# Patient Record
Sex: Female | Born: 1990 | Race: Black or African American | Hispanic: No | Marital: Married | Smoking: Never smoker
Health system: Southern US, Community
[De-identification: ages and names within clinical notes are randomized; demographics above are authoritative.]

## PROBLEM LIST (undated history)

## (undated) DIAGNOSIS — Z789 Other specified health status: Secondary | ICD-10-CM

## (undated) HISTORY — PX: NO PAST SURGERIES: SHX2092

---

## 2016-12-03 NOTE — L&D Delivery Note (Signed)
Pt was admitted for an induction. She was started on pitocin. She progressed to 3cm then rapidly completed the first stage. She pushed x 1 and had a SVD on one live viable infant over an intact perineum in the ROA position. Placenta M/I. EBL-400cc. Baby to NBN.

## 2017-05-15 ENCOUNTER — Encounter: Payer: Self-pay | Admitting: Certified Nurse Midwife

## 2017-09-07 ENCOUNTER — Inpatient Hospital Stay (EMERGENCY_DEPARTMENT_HOSPITAL)
Admission: AD | Admit: 2017-09-07 | Discharge: 2017-09-07 | Disposition: A | Discharge status: Home / Self Care | Payer: Medicaid Other | Source: Ambulatory Visit | Attending: Obstetrics and Gynecology | Admitting: Obstetrics and Gynecology

## 2017-09-07 ENCOUNTER — Encounter (HOSPITAL_COMMUNITY): Payer: Self-pay

## 2017-09-07 DIAGNOSIS — N898 Other specified noninflammatory disorders of vagina: Secondary | ICD-10-CM

## 2017-09-07 DIAGNOSIS — Z3A40 40 weeks gestation of pregnancy: Secondary | ICD-10-CM

## 2017-09-07 DIAGNOSIS — O9989 Other specified diseases and conditions complicating pregnancy, childbirth and the puerperium: Secondary | ICD-10-CM | POA: Diagnosis not present

## 2017-09-07 DIAGNOSIS — O26893 Other specified pregnancy related conditions, third trimester: Secondary | ICD-10-CM | POA: Diagnosis not present

## 2017-09-07 LAB — WET PREP, GENITAL
CLUE CELLS WET PREP: NONE SEEN
Sperm: NONE SEEN
Trich, Wet Prep: NONE SEEN
Yeast Wet Prep HPF POC: NONE SEEN

## 2017-09-07 NOTE — MAU Provider Note (Signed)
S: Ms. Brenda Mata is a 26 y.o. [id number] at [redacted]w[redacted]d  who presents to MAU today complaining of yellow vaginal discharge. She denies vaginal bleeding. She denies contractions. She reports normal fetal movement.    O: BP 116/77 (BP Location: Right Arm)   Pulse 86   Temp 98.1 F (36.7 C)   Resp 18   Ht  (1.626 m)   Wt 203 lb (92.1 kg)   BMI 34.84 kg/m  GENERAL: Well-developed, well-nourished female in no acute distress.  HEAD: Normocephalic, atraumatic.  CHEST: Normal effort of breathing, regular heart rate ABDOMEN: Soft, nontender, gravid PELVIC: Normal external female genitalia. Vagina is pink and rugated.  Cervical exam:    1.5/50/-3/posterior   Fetal Monitoring: Baseline: 130 Variability: moderate Accelerations: 15x15 accels Decelerations: none Contractions: none   Results for orders placed or performed during the hospital encounter of 09/07/17 (from the past 24 hour(s))  Wet prep, genital     Status: Abnormal   Collection Time: 09/07/17  5:45 AM  Result Value Ref Range   Yeast Wet Prep HPF POC NONE SEEN NONE SEEN   Trich, Wet Prep NONE SEEN NONE SEEN   Clue Cells Wet Prep HPF POC NONE SEEN NONE SEEN   WBC, Wet Prep HPF POC FEW (A) NONE SEEN   Sperm NONE SEEN      A: [facility] at [redacted]w[redacted]d   P: Spoke with Dr. Dareen Piano, ok for DC home.   Armando Reichert, CNM 09/07/2017 6:13 AM

## 2017-09-07 NOTE — MAU Note (Signed)
Losing mucous plug for couple days and is yellow. Was concerned may be meconium. No pain. Had some diarrhea Friday but ok now. Denies lOF or bleeding

## 2017-09-07 NOTE — MAU Note (Signed)
Pt here for yellow vaginal discharge. Concerned that it could possibly be meconium. States it is somewhat mucousy. First noticed yesterday. States it is somewhat itchy, but denies odor. Pt denies vaginal bleeding. Reports good fetal movement.

## 2017-09-07 NOTE — Discharge Instructions (Signed)
Third Trimester of Pregnancy The third trimester is from week 28 through week 40 (months 7 through 9). The third trimester is a time when the unborn baby (fetus) is growing rapidly. At the end of the ninth month, the fetus is about 20 inches in length and weighs 6-10 pounds. Body changes during your third trimester Your body will continue to go through many changes during pregnancy. The changes vary from woman to woman. During the third trimester:  Your weight will continue to increase. You can expect to gain 25-35 pounds (11-16 kg) by the end of the pregnancy.  You may begin to get stretch marks on your hips, abdomen, and breasts.  You may urinate more often because the fetus is moving lower into your pelvis and pressing on your bladder.  You may develop or continue to have heartburn. This is caused by increased hormones that slow down muscles in the digestive tract.  You may develop or continue to have constipation because increased hormones slow digestion and cause the muscles that push waste through your intestines to relax.  You may develop hemorrhoids. These are swollen veins (varicose veins) in the rectum that can itch or be painful.  You may develop swollen, bulging veins (varicose veins) in your legs.  You may have increased body aches in the pelvis, back, or thighs. This is due to weight gain and increased hormones that are relaxing your joints.  You may have changes in your hair. These can include thickening of your hair, rapid growth, and changes in texture. Some women also have hair loss during or after pregnancy, or hair that feels dry or thin. Your hair will most likely return to normal after your baby is born.  Your breasts will continue to grow and they will continue to become tender. A yellow fluid (colostrum) may leak from your breasts. This is the first milk you are producing for your baby.  Your belly button may stick out.  You may notice more swelling in your hands,  face, or ankles.  You may have increased tingling or numbness in your hands, arms, and legs. The skin on your belly may also feel numb.  You may feel short of breath because of your expanding uterus.  You may have more problems sleeping. This can be caused by the size of your belly, increased need to urinate, and an increase in your body's metabolism.  You may notice the fetus "dropping," or moving lower in your abdomen (lightening).  You may have increased vaginal discharge.  You may notice your joints feel loose and you may have pain around your pelvic bone.  What to expect at prenatal visits You will have prenatal exams every 2 weeks until week 36. Then you will have weekly prenatal exams. During a routine prenatal visit:  You will be weighed to make sure you and the baby are growing normally.  Your blood pressure will be taken.  Your abdomen will be measured to track your baby's growth.  The fetal heartbeat will be listened to.  Any test results from the previous visit will be discussed.  You may have a cervical check near your due date to see if your cervix has softened or thinned (effaced).  You will be tested for Group B streptococcus. This happens between 35 and 37 weeks.  Your health care provider may ask you:  What your birth plan is.  How you are feeling.  If you are feeling the baby move.  If you have had   any abnormal symptoms, such as leaking fluid, bleeding, severe headaches, or abdominal cramping.  If you are using any tobacco products, including cigarettes, chewing tobacco, and electronic cigarettes.  If you have any questions.  Other tests or screenings that may be performed during your third trimester include:  Blood tests that check for low iron levels (anemia).  Fetal testing to check the health, activity level, and growth of the fetus. Testing is done if you have certain medical conditions or if there are problems during the  pregnancy.  Nonstress test (NST). This test checks the health of your baby to make sure there are no signs of problems, such as the baby not getting enough oxygen. During this test, a belt is placed around your belly. The baby is made to move, and its heart rate is monitored during movement.  What is false labor? False labor is a condition in which you feel small, irregular tightenings of the muscles in the womb (contractions) that usually go away with rest, changing position, or drinking water. These are called Braxton Hicks contractions. Contractions may last for hours, days, or even weeks before true labor sets in. If contractions come at regular intervals, become more frequent, increase in intensity, or become painful, you should see your health care provider. What are the signs of labor?  Abdominal cramps.  Regular contractions that start at 10 minutes apart and become stronger and more frequent with time.  Contractions that start on the top of the uterus and spread down to the lower abdomen and back.  Increased pelvic pressure and dull back pain.  A watery or bloody mucus discharge that comes from the vagina.  Leaking of amniotic fluid. This is also known as your "water breaking." It could be a slow trickle or a gush. Let your health care provider know if it has a color or strange odor. If you have any of these signs, call your health care provider right away, even if it is before your due date. Follow these instructions at home: Medicines  Follow your health care provider's instructions regarding medicine use. Specific medicines may be either safe or unsafe to take during pregnancy.  Take a prenatal vitamin that contains at least 600 micrograms (mcg) of folic acid.  If you develop constipation, try taking a stool softener if your health care provider approves. Eating and drinking  Eat a balanced diet that includes fresh fruits and vegetables, whole grains, good sources of protein  such as meat, eggs, or tofu, and low-fat dairy. Your health care provider will help you determine the amount of weight gain that is right for you.  Avoid raw meat and uncooked cheese. These carry germs that can cause birth defects in the baby.  If you have low calcium intake from food, talk to your health care provider about whether you should take a daily calcium supplement.  Eat four or five small meals rather than three large meals a day.  Limit foods that are high in fat and processed sugars, such as fried and sweet foods.  To prevent constipation: ? Drink enough fluid to keep your urine clear or pale yellow. ? Eat foods that are high in fiber, such as fresh fruits and vegetables, whole grains, and beans. Activity  Exercise only as directed by your health care provider. Most women can continue their usual exercise routine during pregnancy. Try to exercise for 30 minutes at least 5 days a week. Stop exercising if you experience uterine contractions.  Avoid heavy   lifting.  Do not exercise in extreme heat or humidity, or at high altitudes.  Wear low-heel, comfortable shoes.  Practice good posture.  You may continue to have sex unless your health care provider tells you otherwise. Relieving pain and discomfort  Take frequent breaks and rest with your legs elevated if you have leg cramps or low back pain.  Take warm sitz baths to soothe any pain or discomfort caused by hemorrhoids. Use hemorrhoid cream if your health care provider approves.  Wear a good support bra to prevent discomfort from breast tenderness.  If you develop varicose veins: ? Wear support pantyhose or compression stockings as told by your healthcare provider. ? Elevate your feet for 15 minutes, 3-4 times a day. Prenatal care  Write down your questions. Take them to your prenatal visits.  Keep all your prenatal visits as told by your health care provider. This is important. Safety  Wear your seat belt at  all times when driving.  Make a list of emergency phone numbers, including numbers for family, friends, the hospital, and police and fire departments. General instructions  Avoid cat litter boxes and soil used by cats. These carry germs that can cause birth defects in the baby. If you have a cat, ask someone to clean the litter box for you.  Do not travel far distances unless it is absolutely necessary and only with the approval of your health care provider.  Do not use hot tubs, steam rooms, or saunas.  Do not drink alcohol.  Do not use any products that contain nicotine or tobacco, such as cigarettes and e-cigarettes. If you need help quitting, ask your health care provider.  Do not use any medicinal herbs or unprescribed drugs. These chemicals affect the formation and growth of the baby.  Do not douche or use tampons or scented sanitary pads.  Do not cross your legs for long periods of time.  To prepare for the arrival of your baby: ? Take prenatal classes to understand, practice, and ask questions about labor and delivery. ? Make a trial run to the hospital. ? Visit the hospital and tour the maternity area. ? Arrange for maternity or paternity leave through employers. ? Arrange for family and friends to take care of pets while you are in the hospital. ? Purchase a rear-facing car seat and make sure you know how to install it in your car. ? Pack your hospital bag. ? Prepare the baby's nursery. Make sure to remove all pillows and stuffed animals from the baby's crib to prevent suffocation.  Visit your dentist if you have not gone during your pregnancy. Use a soft toothbrush to brush your teeth and be gentle when you floss. Contact a health care provider if:  You are unsure if you are in labor or if your water has broken.  You become dizzy.  You have mild pelvic cramps, pelvic pressure, or nagging pain in your abdominal area.  You have lower back pain.  You have persistent  nausea, vomiting, or diarrhea.  You have an unusual or bad smelling vaginal discharge.  You have pain when you urinate. Get help right away if:  Your water breaks before 37 weeks.  You have regular contractions less than 5 minutes apart before 37 weeks.  You have a fever.  You are leaking fluid from your vagina.  You have spotting or bleeding from your vagina.  You have severe abdominal pain or cramping.  You have rapid weight loss or weight gain.    You have shortness of breath with chest pain.  You notice sudden or extreme swelling of your face, hands, ankles, feet, or legs.  Your baby makes fewer than 10 movements in 2 hours.  You have severe headaches that do not go away when you take medicine.  You have vision changes. Summary  The third trimester is from week 28 through week 40, months 7 through 9. The third trimester is a time when the unborn baby (fetus) is growing rapidly.  During the third trimester, your discomfort may increase as you and your baby continue to gain weight. You may have abdominal, leg, and back pain, sleeping problems, and an increased need to urinate.  During the third trimester your breasts will keep growing and they will continue to become tender. A yellow fluid (colostrum) may leak from your breasts. This is the first milk you are producing for your baby.  False labor is a condition in which you feel small, irregular tightenings of the muscles in the womb (contractions) that eventually go away. These are called Braxton Hicks contractions. Contractions may last for hours, days, or even weeks before true labor sets in.  Signs of labor can include: abdominal cramps; regular contractions that start at 10 minutes apart and become stronger and more frequent with time; watery or bloody mucus discharge that comes from the vagina; increased pelvic pressure and dull back pain; and leaking of amniotic fluid. This information is not intended to replace advice  given to you by your health care provider. Make sure you discuss any questions you have with your health care provider. Document Released: 11/13/2001 Document Revised: 04/26/2016 Document Reviewed: 01/20/2013 Elsevier Interactive Patient Education  2017 [employer]  

## 2017-09-08 ENCOUNTER — Inpatient Hospital Stay (HOSPITAL_COMMUNITY)
Admission: AD | Admit: 2017-09-08 | Discharge: 2017-09-10 | DRG: 807 | Disposition: A | Discharge status: Home / Self Care | Payer: Medicaid Other | Source: Ambulatory Visit | Attending: Obstetrics and Gynecology | Admitting: Obstetrics and Gynecology

## 2017-09-08 ENCOUNTER — Inpatient Hospital Stay (HOSPITAL_COMMUNITY): Payer: Medicaid Other

## 2017-09-08 ENCOUNTER — Encounter (HOSPITAL_COMMUNITY): Payer: Self-pay

## 2017-09-08 DIAGNOSIS — N898 Other specified noninflammatory disorders of vagina: Secondary | ICD-10-CM | POA: Diagnosis not present

## 2017-09-08 DIAGNOSIS — Z3490 Encounter for supervision of normal pregnancy, unspecified, unspecified trimester: Secondary | ICD-10-CM

## 2017-09-08 DIAGNOSIS — Z3A40 40 weeks gestation of pregnancy: Secondary | ICD-10-CM

## 2017-09-08 DIAGNOSIS — O9989 Other specified diseases and conditions complicating pregnancy, childbirth and the puerperium: Secondary | ICD-10-CM | POA: Diagnosis not present

## 2017-09-08 DIAGNOSIS — O26893 Other specified pregnancy related conditions, third trimester: Secondary | ICD-10-CM | POA: Diagnosis present

## 2017-09-08 HISTORY — DX: Other specified health status: Z78.9

## 2017-09-08 LAB — CBC
HEMATOCRIT: 34.6 % — AB (ref 36.0–46.0)
HEMOGLOBIN: 11.6 g/dL — AB (ref 12.0–15.0)
MCH: 31.5 pg (ref 26.0–34.0)
MCHC: 33.5 g/dL (ref 30.0–36.0)
MCV: 94 fL (ref 78.0–100.0)
PLATELETS: 210 10*3/uL (ref 150–400)
RBC: 3.68 MIL/uL — AB (ref 3.87–5.11)
RDW: 14.5 % (ref 11.5–15.5)
WBC: 8.7 10*3/uL (ref 4.0–10.5)

## 2017-09-08 LAB — TYPE AND SCREEN
ABO/RH(D): O POS
Antibody Screen: NEGATIVE

## 2017-09-08 LAB — AMNISURE RUPTURE OF MEMBRANE (ROM) NOT AT ARMC: AMNISURE: NEGATIVE

## 2017-09-08 MED ORDER — ONDANSETRON HCL 4 MG/2ML IJ SOLN: 4.0000 mg | Freq: Four times a day (QID) | INTRAMUSCULAR | Status: DC | PRN

## 2017-09-08 MED ORDER — FLEET ENEMA 7-19 GM/118ML RE ENEM: 1.0000 | ENEMA | RECTAL | Status: DC | PRN

## 2017-09-08 MED ORDER — LACTATED RINGERS IV SOLN
INTRAVENOUS | Status: DC
  Administered 2017-09-08 – 2017-09-09 (×2): via INTRAVENOUS

## 2017-09-08 MED ORDER — ACETAMINOPHEN 325 MG PO TABS: 650.0000 mg | ORAL_TABLET | ORAL | Status: DC | PRN

## 2017-09-08 MED ORDER — TERBUTALINE SULFATE 1 MG/ML IJ SOLN
0.2500 mg | Freq: Once | INTRAMUSCULAR | Status: DC | PRN
  Filled 2017-09-08: qty 1

## 2017-09-08 MED ORDER — OXYCODONE-ACETAMINOPHEN 5-325 MG PO TABS: 2.0000 | ORAL_TABLET | ORAL | Status: DC | PRN

## 2017-09-08 MED ORDER — MISOPROSTOL 25 MCG QUARTER TABLET
25.0000 ug | ORAL_TABLET | ORAL | Status: DC | PRN
  Administered 2017-09-08: 25 ug via VAGINAL
  Filled 2017-09-08 (×2): qty 1

## 2017-09-08 MED ORDER — OXYTOCIN BOLUS FROM INFUSION
500.0000 mL | Freq: Once | INTRAVENOUS | Status: AC
  Administered 2017-09-09: 500 mL via INTRAVENOUS

## 2017-09-08 MED ORDER — FENTANYL CITRATE (PF) 100 MCG/2ML IJ SOLN
100.0000 ug | INTRAMUSCULAR | Status: DC | PRN
  Administered 2017-09-09: 100 ug via INTRAVENOUS
  Filled 2017-09-08: qty 2

## 2017-09-08 MED ORDER — SOD CITRATE-CITRIC ACID 500-334 MG/5ML PO SOLN: 30.0000 mL | ORAL | Status: DC | PRN

## 2017-09-08 MED ORDER — OXYCODONE-ACETAMINOPHEN 5-325 MG PO TABS: 1.0000 | ORAL_TABLET | ORAL | Status: DC | PRN

## 2017-09-08 MED ORDER — LIDOCAINE HCL (PF) 1 % IJ SOLN
30.0000 mL | INTRAMUSCULAR | Status: DC | PRN
  Filled 2017-09-08: qty 30

## 2017-09-08 MED ORDER — LACTATED RINGERS IV SOLN: 500.0000 mL | INTRAVENOUS | Status: DC | PRN

## 2017-09-08 MED ORDER — OXYTOCIN 40 UNITS IN LACTATED RINGERS INFUSION - SIMPLE MED: 2.5000 [IU]/h | INTRAVENOUS | Status: DC

## 2017-09-08 NOTE — MAU Note (Signed)
States she went to the bathroom and saw thin, bright red blood in the toilet. Some ctx this morning but none since. Felt a gush of fluid whil on the toilet after urinating. Unsure if amniotic fluid or urine. +fetal movement

## 2017-09-08 NOTE — MAU Provider Note (Signed)
  History     CSN: [id number]  Arrival date and time: 09/08/17 1725   First Provider Initiated Contact with Patient 09/08/17 1803      Chief Complaint  Patient presents with  . Vaginal Bleeding  . Rupture of Membranes   HPI Brenda Mata 26 y.o. [redacted]w[redacted]d Comes to MAU tonight when she saw bleeding in the toilet and felt a gush of fluid and thought her water had broken.  No further bleeding on the way to the hospital.  Is not feeling leaking now.  Having some contractions.  Thought she would be in labor as she had her membranes stripped earlier this week in the office.   OB History    Gravida Para Term Preterm AB Living   0 1 4   SAB TAB Ectopic Multiple Live Births   0 1 0 0 4      Past Medical History:  Diagnosis Date  . Medical history non-contributory     Past Surgical History:  Procedure Laterality Date  . NO PAST SURGERIES      History reviewed. No pertinent family history.  Social History  Substance Use Topics  . Smoking status: Never Smoker  . Smokeless tobacco: Never Used  . Alcohol use No    Allergies: No Known Allergies  No prescriptions prior to admission.    Review of Systems Physical Exam   Blood pressure 127/79, pulse (!) 113, temperature 99.1 F (37.3 C), temperature source Oral, resp. rate 16.  Physical Exam  Nursing note and vitals reviewed. Constitutional: She is oriented to person, place, and time. She appears well-developed and well-nourished.  HENT:  Head: Normocephalic.  Eyes: EOM are normal.  Neck: Neck supple.  GI: Soft. There is no tenderness. There is no rebound and no guarding.  Fetal monitor strip. 140 baseline with minimal to mod variablility.  No accels even with position change, PO juice and acoustic stim x 1 (one accel then).  Contractions >10 apart.  No decelerations.  Strip is not reactive.  Genitourinary:  Genitourinary Comments: Speculum exam Lots of mucousy yellow discharge, no pooling of fluid, no blood seen.   Cervical exam 1-2 soft, posterior, baby at -3 station, no blood on glove after exam  Musculoskeletal: Normal range of motion.  Neurological: She is alert and oriented to person, place, and time.  Skin: Skin is warm and dry.  Psychiatric: She has a normal mood and affect.    MAU Course  Procedures Results for orders placed or performed during the hospital encounter of 09/08/17 (from the past 24 hour(s))  Amnisure rupture of membrane (rom)not at [facility]     Status: None   Collection Time: 09/08/17  6:15 PM  Result Value Ref Range   Amnisure ROM NEGATIVE     MDM 1900 Consult with Dr. Dareen Piano - to get BPP due to nonreactive FHT strip BPP done and was 6/8 - nurse called Dr. Dareen Piano and decision made for admission.  Assessment and Plan  Admission - care will be continued by Dr. Johnell Comings 09/08/2017, 6:38 PM

## 2017-09-09 ENCOUNTER — Inpatient Hospital Stay (HOSPITAL_COMMUNITY): Payer: Medicaid Other | Admitting: Anesthesiology

## 2017-09-09 ENCOUNTER — Encounter (HOSPITAL_COMMUNITY): Payer: Self-pay

## 2017-09-09 DIAGNOSIS — Z3490 Encounter for supervision of normal pregnancy, unspecified, unspecified trimester: Secondary | ICD-10-CM

## 2017-09-09 LAB — ABO/RH: ABO/RH(D): O POS

## 2017-09-09 MED ORDER — SENNOSIDES-DOCUSATE SODIUM 8.6-50 MG PO TABS
2.0000 | ORAL_TABLET | ORAL | Status: DC
  Administered 2017-09-10: 2 via ORAL
  Filled 2017-09-09: qty 2

## 2017-09-09 MED ORDER — EPHEDRINE 5 MG/ML INJ
10.0000 mg | INTRAVENOUS | Status: DC | PRN
  Filled 2017-09-09: qty 2

## 2017-09-09 MED ORDER — OXYTOCIN 40 UNITS IN LACTATED RINGERS INFUSION - SIMPLE MED
1.0000 m[IU]/min | INTRAVENOUS | Status: DC
  Filled 2017-09-09: qty 1000

## 2017-09-09 MED ORDER — LIDOCAINE HCL (PF) 1 % IJ SOLN
INTRAMUSCULAR | Status: DC | PRN
  Administered 2017-09-09 (×2): 5 mL via EPIDURAL

## 2017-09-09 MED ORDER — TERBUTALINE SULFATE 1 MG/ML IJ SOLN
0.2500 mg | Freq: Once | INTRAMUSCULAR | Status: DC | PRN
  Filled 2017-09-09: qty 1

## 2017-09-09 MED ORDER — DIBUCAINE 1 % RE OINT: 1.0000 "application " | TOPICAL_OINTMENT | RECTAL | Status: DC | PRN

## 2017-09-09 MED ORDER — BENZOCAINE-MENTHOL 20-0.5 % EX AERO
1.0000 "application " | INHALATION_SPRAY | CUTANEOUS | Status: DC | PRN
  Administered 2017-09-09: 1 via TOPICAL
  Filled 2017-09-09: qty 56

## 2017-09-09 MED ORDER — IBUPROFEN 600 MG PO TABS
600.0000 mg | ORAL_TABLET | Freq: Four times a day (QID) | ORAL | Status: DC
  Administered 2017-09-09 – 2017-09-10 (×5): 600 mg via ORAL
  Filled 2017-09-09 (×6): qty 1

## 2017-09-09 MED ORDER — DIPHENHYDRAMINE HCL 50 MG/ML IJ SOLN: 12.5000 mg | INTRAMUSCULAR | Status: DC | PRN

## 2017-09-09 MED ORDER — ONDANSETRON HCL 4 MG/2ML IJ SOLN: 4.0000 mg | INTRAMUSCULAR | Status: DC | PRN

## 2017-09-09 MED ORDER — COCONUT OIL OIL: 1.0000 "application " | TOPICAL_OIL | Status: DC | PRN

## 2017-09-09 MED ORDER — PHENYLEPHRINE 40 MCG/ML (10ML) SYRINGE FOR IV PUSH (FOR BLOOD PRESSURE SUPPORT)
80.0000 ug | PREFILLED_SYRINGE | INTRAVENOUS | Status: DC | PRN
  Filled 2017-09-09: qty 5
  Filled 2017-09-09: qty 10

## 2017-09-09 MED ORDER — TETANUS-DIPHTH-ACELL PERTUSSIS 5-2.5-18.5 LF-MCG/0.5 IM SUSP
0.5000 mL | Freq: Once | INTRAMUSCULAR | Status: AC
  Administered 2017-09-10: 0.5 mL via INTRAMUSCULAR
  Filled 2017-09-09: qty 0.5

## 2017-09-09 MED ORDER — ACETAMINOPHEN 325 MG PO TABS
650.0000 mg | ORAL_TABLET | ORAL | Status: DC | PRN
  Administered 2017-09-09: 650 mg via ORAL
  Filled 2017-09-09: qty 2

## 2017-09-09 MED ORDER — MEASLES, MUMPS & RUBELLA VAC ~~LOC~~ INJ
0.5000 mL | INJECTION | Freq: Once | SUBCUTANEOUS | Status: DC
  Filled 2017-09-09: qty 0.5

## 2017-09-09 MED ORDER — PHENYLEPHRINE 40 MCG/ML (10ML) SYRINGE FOR IV PUSH (FOR BLOOD PRESSURE SUPPORT)
80.0000 ug | PREFILLED_SYRINGE | INTRAVENOUS | Status: DC | PRN
  Filled 2017-09-09: qty 5

## 2017-09-09 MED ORDER — LACTATED RINGERS IV SOLN: 500.0000 mL | Freq: Once | INTRAVENOUS | Status: DC

## 2017-09-09 MED ORDER — WITCH HAZEL-GLYCERIN EX PADS: 1.0000 "application " | MEDICATED_PAD | CUTANEOUS | Status: DC | PRN

## 2017-09-09 MED ORDER — FENTANYL 2.5 MCG/ML BUPIVACAINE 1/10 % EPIDURAL INFUSION (WH - ANES)
14.0000 mL/h | INTRAMUSCULAR | Status: DC | PRN
  Administered 2017-09-09: 14 mL/h via EPIDURAL
  Filled 2017-09-09: qty 100

## 2017-09-09 MED ORDER — ONDANSETRON HCL 4 MG PO TABS: 4.0000 mg | ORAL_TABLET | ORAL | Status: DC | PRN

## 2017-09-09 MED ORDER — ZOLPIDEM TARTRATE 5 MG PO TABS: 5.0000 mg | ORAL_TABLET | Freq: Every evening | ORAL | Status: DC | PRN

## 2017-09-09 MED ORDER — LACTATED RINGERS IV SOLN
500.0000 mL | Freq: Once | INTRAVENOUS | Status: AC
  Administered 2017-09-09: 500 mL via INTRAVENOUS

## 2017-09-09 MED ORDER — SIMETHICONE 80 MG PO CHEW: 80.0000 mg | CHEWABLE_TABLET | ORAL | Status: DC | PRN

## 2017-09-09 NOTE — Anesthesia Preprocedure Evaluation (Signed)

## 2017-09-09 NOTE — Anesthesia Postprocedure Evaluation (Signed)
Anesthesia Post Note  Patient: Brenda Mata  Procedure(s) Performed: AN AD HOC LABOR EPIDURAL     Patient location during evaluation: Mother Baby Anesthesia Type: Epidural Level of consciousness: awake and alert and oriented Pain management: pain level controlled Vital Signs Assessment: post-procedure vital signs reviewed and stable Respiratory status: spontaneous breathing and nonlabored ventilation Cardiovascular status: stable Postop Assessment: no headache, patient able to bend at knees, no backache, no apparent nausea or vomiting, epidural receding and adequate PO intake Anesthetic complications: no    Last Vitals:  Vitals:   09/09/17 0535 09/09/17 0634  BP: 127/74 128/71  Pulse: (!) 102 (!) 104  Resp: 20 20  Temp: 37.8 C 37.6 C  SpO2:      Last Pain:  Vitals:   09/09/17 0720  TempSrc:   PainSc: 4    Pain Goal:                 Laban Emperor

## 2017-09-09 NOTE — Addendum Note (Signed)
Addendum  created 09/09/17 0417 by Bethena Midget, MD   Sign clinical note

## 2017-09-09 NOTE — Lactation Note (Signed)
This note was copied from a baby's chart. Lactation Consultation Note  Patient Name: Brenda Mata [id number] Date: 09/09/2017 Reason for consult: Initial assessment  Baby 14 hours old. [name], RN in room assisting parents with hand expression and spoon feeding EBM. Mom reports that baby tongue-thrusting. Enc suck training in between and just prior to latching, and continuing to supplement with EBM as needed--reinforcing what had already been enc by [name], Charity fundraiser. Mom reports that she did not BF her 4 older girls, but her milk did "come in" with her 4th child. Mom given [facility] brochure, aware of OP/BFSG and LC phone line assistance after D/C. Enc mom to offer lots of STS and nurse with cues.    Maternal Data    Feeding    LATCH Score                   Interventions Interventions:  (Mom instructed to notify RN at next feeding for [facility] score)  Lactation Tools Discussed/Used     Consult Status Consult Status: Follow-up Date: 09/10/17 Follow-up type: In-patient    [name] 09/09/2017, 6:26 PM

## 2017-09-09 NOTE — Anesthesia Procedure Notes (Signed)
Epidural Patient location during procedure: OB Start time: 09/09/2017 3:27 AM End time: 09/09/2017 3:35 AM  Staffing Anesthesiologist: Tieara Flitton  Preanesthetic Checklist Completed: patient identified, site marked, surgical consent, pre-op evaluation, timeout performed, IV checked, risks and benefits discussed and monitors and equipment checked  Epidural Patient position: sitting Prep: site prepped and draped and DuraPrep Patient monitoring: continuous pulse ox and blood pressure Approach: midline Location: L4-L5 Injection technique: LOR air  Needle:  Needle type: Tuohy  Needle gauge: 17 G Needle length: 9 cm and 9 Needle insertion depth: 9 cm Catheter type: closed end flexible Catheter size: 19 Gauge Catheter at skin depth: 13 cm Test dose: negative  Assessment Events: blood not aspirated, injection not painful, no injection resistance, negative IV test and no paresthesia

## 2017-09-09 NOTE — H&P (Signed)
Brenda Mata is an 26 y.o. [phone] [redacted]w[redacted]d black female who is admitted for an induction. She had presented to the ER earlier c/o vaginal bleeding. On exam there was no evidence of any blood. The NST was not considered reactive therefore she had a BPP which was 6/8. Given her gest age she was admitted for an elective induction.Pt transferred care to our office at 33 weeks. She did not have any complication. She had a nl Quad screen and OGTT Chief Complaint: HPI:  Past Medical History:  Diagnosis Date  . Medical history non-contributory     Past Surgical History:  Procedure Laterality Date  . NO PAST SURGERIES      History reviewed. No pertinent family history. Social History:  reports that she has never smoked. She has never used smokeless tobacco. She reports that she does not drink alcohol or use drugs.  Allergies: No Known Allergies  No prescriptions prior to admission.       Blood pressure 138/90, pulse (!) 108, temperature 99.9 F (37.7 C), temperature source Oral, resp. rate 20, height  (1.6 m), weight 203 lb (92.1 kg), SpO2 99 %. General appearance: alert and cooperative Abdomen: gravid, non tender   Lab Results  Component Value Date   WBC 8.7 09/08/2017   HGB 11.6 (L) 09/08/2017   HCT 34.6 (L) 09/08/2017   MCV 94.0 09/08/2017   PLT 210 09/08/2017   No results found for: PREGTESTUR, PREGSERUM, HCG, HCGQUANT     Patient Active Problem List   Diagnosis Date Noted  . Indication for care in labor or delivery 09/08/2017   IMP/ IUP at term         [facility] multip Plan/Admit         Induce  Brenda Mata E 09/09/2017, 4:07 AM

## 2017-09-09 NOTE — Anesthesia Postprocedure Evaluation (Signed)
Anesthesia Post Note  Patient: Brenda Mata  Procedure(s) Performed: AN AD HOC LABOR EPIDURAL     Patient location during evaluation: Mother Baby Anesthesia Type: Epidural Level of consciousness: awake and alert Pain management: pain level controlled Vital Signs Assessment: post-procedure vital signs reviewed and stable Respiratory status: spontaneous breathing, nonlabored ventilation and respiratory function stable Cardiovascular status: stable Postop Assessment: no headache, no backache and epidural receding Anesthetic complications: no    Last Vitals:  Vitals:   09/08/17 2231 09/09/17 0242  BP: 118/73 138/90  Pulse: 97 (!) 108  Resp: 20 20  Temp: 36.6 C 37.7 C  SpO2: 99%     Last Pain:  Vitals:   09/09/17 0242  TempSrc: Oral  PainSc: 9    Pain Goal:                 Aaryav Hopfensperger

## 2017-09-09 NOTE — Addendum Note (Signed)
Addendum  created 09/09/17 0753 by Andrika Peraza H, CRNA   Charge Capture section accepted, Sign clinical note    

## 2017-09-09 NOTE — Anesthesia Postprocedure Evaluation (Signed)
Anesthesia Post Note  Patient: Brenda Mata  Procedure(s) Performed: AN AD HOC LABOR EPIDURAL     Patient location during evaluation: Mother Baby Anesthesia Type: Epidural Level of consciousness: awake and alert Pain management: pain level controlled Vital Signs Assessment: post-procedure vital signs reviewed and stable Respiratory status: spontaneous breathing, nonlabored ventilation and respiratory function stable Cardiovascular status: stable Postop Assessment: no headache, no backache and epidural receding Anesthetic complications: no    Last Vitals:  Vitals:   09/08/17 2231 09/09/17 0242  BP: 118/73 138/90  Pulse: 97 (!) 108  Resp: 20 20  Temp: 36.6 C 37.7 C  SpO2: 99%     Last Pain:  Vitals:   09/09/17 0242  TempSrc: Oral  PainSc: 9    Pain Goal:                 Emrys Mckamie     

## 2017-09-09 NOTE — Plan of Care (Signed)
Problem: Education: Goal: Knowledge of condition will improve Outcome: Progressing Pt educated on skin to skin & BF position/latch.  Demos appropriate perineal care.  Pt has been given VIS on tdap & flu; declines flu & accepts tdap prior to d/c.  Mom/baby care booklet at bedside.  Problem: Role Relationship: Goal: Ability to demonstrate positive interaction with newborn will improve Outcome: Progressing Patient very engaged in care of newborn & has desire to learn.  Problem: Pain Management: Goal: General experience of comfort will improve and pain level will decrease Outcome: Progressing Pt pain has been adequately managed with scheduled Motrin.  Problem: Urinary Elimination: Goal: Ability to reestablish a normal urinary elimination pattern will improve Outcome: Progressing Pt voids without difficulty.

## 2017-09-10 LAB — RPR: RPR: NONREACTIVE

## 2017-09-10 MED ORDER — OXYCODONE-ACETAMINOPHEN 5-325 MG PO TABS: 1.0000 | ORAL_TABLET | ORAL | 0 refills | Status: AC | PRN

## 2017-09-10 MED ORDER — IBUPROFEN 600 MG PO TABS: 600.0000 mg | ORAL_TABLET | Freq: Four times a day (QID) | ORAL | 0 refills | Status: AC | PRN

## 2017-09-10 MED ORDER — DOCUSATE SODIUM 100 MG PO CAPS: 100.0000 mg | ORAL_CAPSULE | Freq: Two times a day (BID) | ORAL | 0 refills | Status: AC

## 2017-09-10 NOTE — Discharge Summary (Signed)
Obstetric Discharge Summary Reason for Admission: induction of labor Prenatal Procedures: NST and ultrasound Intrapartum Procedures: SVD Postpartum Procedures: none Complications-Operative and Postpartum: none Hemoglobin  Date Value Ref Range Status  09/08/2017 11.6 (L) 12.0 - 15.0 g/dL Final   HCT  Date Value Ref Range Status  09/08/2017 34.6 (L) 36.0 - 46.0 % Final    Physical Exam:  General: alert, cooperative and appears stated age 26: appropriate Uterine Fundus: firm DVT Evaluation: No evidence of DVT seen on physical exam.  Discharge Diagnoses: Term Pregnancy-delivered  Discharge Information: Date: 09/10/2017 Activity: pelvic rest Diet: routine Medications: Ibuprofen, Colace and Percocet Condition: stable Instructions: refer to practice specific booklet Discharge to: home Follow-up Information    Levi Aland, MD Follow up.   Specialty:  Obstetrics and Gynecology Contact information: [address] [place] [address] [phone]           Newborn Data: Live born female  Birth Weight: 8 lb 1.3 oz (3666 g) APGAR: 8, 9  Newborn Delivery   Birth date/time:  09/09/2017 03:51:00 Delivery type:  Vaginal, Spontaneous Delivery      Home with mother.  Cait Locust H. 09/10/2017, 9:05 AM

## 2017-11-08 ENCOUNTER — Encounter (HOSPITAL_COMMUNITY)
Admission: RE | Admit: 2017-11-08 | Discharge: 2017-11-08 | Disposition: A | Discharge status: Home / Self Care | Payer: Medicaid Other | Source: Ambulatory Visit | Attending: Obstetrics and Gynecology | Admitting: Obstetrics and Gynecology

## 2017-11-08 NOTE — Patient Instructions (Signed)
Your procedure is scheduled on:  Enter through the Main Entrance of [facility] at:  Pick up the phone at the desk and dial [phone].  Call this number if you have problems the morning of surgery: [phone].  Remember: Do NOT eat food: Do NOT drink clear liquids after: Take these medicines the morning of surgery with a SIP OF WATER:  Do NOT wear jewelry (body piercing), metal hair clips/bobby pins, make-up, or nail polish. Do NOT wear lotions, powders, or perfumes.  You may wear deoderant. Do NOT shave for 48 hours prior to surgery. Do NOT bring valuables to the hospital. Contacts, dentures, or bridgework may not be worn into surgery. Leave suitcase in car.  After surgery it may be brought to your room.  For patients admitted to the hospital, checkout time is 11:00 AM the day of discharge. Have a responsible adult drive you home and stay with you for 24 hours after your procedure 

## 2017-11-18 ENCOUNTER — Encounter (HOSPITAL_COMMUNITY): Admission: AD | Payer: Self-pay | Source: Ambulatory Visit

## 2017-11-18 ENCOUNTER — Ambulatory Visit (HOSPITAL_COMMUNITY)
Admission: AD | Admit: 2017-11-18 | Payer: Medicaid Other | Source: Ambulatory Visit | Admitting: Obstetrics and Gynecology

## 2017-11-18 SURGERY — LIGATION, FALLOPIAN TUBE, LAPAROSCOPIC
Anesthesia: Choice | Laterality: Bilateral

## 2019-08-15 ENCOUNTER — Encounter (HOSPITAL_BASED_OUTPATIENT_CLINIC_OR_DEPARTMENT_OTHER): Payer: Self-pay | Admitting: Emergency Medicine

## 2019-08-15 ENCOUNTER — Emergency Department (HOSPITAL_BASED_OUTPATIENT_CLINIC_OR_DEPARTMENT_OTHER)
Admission: EM | Admit: 2019-08-15 | Discharge: 2019-08-15 | Disposition: A | Discharge status: Home / Self Care | Payer: Medicaid Other | Attending: Emergency Medicine | Admitting: Emergency Medicine

## 2019-08-15 ENCOUNTER — Other Ambulatory Visit: Payer: Self-pay

## 2019-08-15 DIAGNOSIS — S0990XA Unspecified injury of head, initial encounter: Secondary | ICD-10-CM

## 2019-08-15 DIAGNOSIS — Y939 Activity, unspecified: Secondary | ICD-10-CM | POA: Insufficient documentation

## 2019-08-15 DIAGNOSIS — W208XXA Other cause of strike by thrown, projected or falling object, initial encounter: Secondary | ICD-10-CM | POA: Insufficient documentation

## 2019-08-15 DIAGNOSIS — Y999 Unspecified external cause status: Secondary | ICD-10-CM | POA: Insufficient documentation

## 2019-08-15 DIAGNOSIS — Z79899 Other long term (current) drug therapy: Secondary | ICD-10-CM | POA: Insufficient documentation

## 2019-08-15 DIAGNOSIS — F0781 Postconcussional syndrome: Secondary | ICD-10-CM

## 2019-08-15 DIAGNOSIS — Y92000 Kitchen of unspecified non-institutional (private) residence as  the place of occurrence of the external cause: Secondary | ICD-10-CM | POA: Insufficient documentation

## 2019-08-15 NOTE — ED Triage Notes (Signed)
Pt had an object fall on her head Friday morning and states she has had difficulty concentrating since

## 2019-08-15 NOTE — Discharge Instructions (Signed)
You were seen in the emergency department for headache and difficulty with concentration after a head injury.  This is likely a concussion and your symptoms can persist for a while.  You should rest and use Tylenol and ibuprofen as needed for pain.  Return to the emergency department if any worsening symptoms.

## 2019-08-15 NOTE — ED Provider Notes (Signed)
[facility] EMERGENCY DEPARTMENT Provider Note   CSN: [id number] Arrival date & time: 08/15/19  1118     History   Chief Complaint Chief Complaint  Patient presents with  . Head Injury    HPI Brenda Mata is a 28 y.o. female.  She experienced a head injury Friday night (2 days ago) when a heavy object fell off of the refrigerator and struck her in the head.  There was no loss consciousness.  Since then she has had a headache and some blurry vision and difficulty concentrating.  She had to leave work yesterday due to her symptoms and called out again today.  No nausea or vomiting.  No double vision no weakness no numbness.  Not on any anticoagulation.     The history is provided by the patient.  Head Injury Location:  Generalized Time since incident:  3 days Mechanism of injury: direct blow   Pain details:    Quality:  Dull   Severity:  Moderate   Timing:  Intermittent   Progression:  Unchanged Chronicity:  New Relieved by:  Rest Worsened by:  Movement Ineffective treatments:  None tried Associated symptoms: blurred vision and headache   Associated symptoms: no difficulty breathing, no disorientation, no double vision, no focal weakness, no hearing loss, no loss of consciousness, no memory loss, no nausea, no neck pain, no numbness, no seizures, no tinnitus and no vomiting     Past Medical History:  Diagnosis Date  . Medical history non-contributory     Patient Active Problem List   Diagnosis Date Noted  . Pregnancy 09/09/2017  . Indication for care in labor or delivery 09/08/2017    Past Surgical History:  Procedure Laterality Date  . NO PAST SURGERIES       OB History    Gravida  6   Para  5   Term  5   Preterm  0   AB  1   Living  5     SAB  0   TAB  1   Ectopic  0   Multiple  0   Live Births  5            Home Medications    Prior to Admission medications   Medication Sig Start Date End Date Taking? Authorizing  Provider  docusate sodium (COLACE) 100 MG capsule Take 1 capsule (100 mg total) by mouth 2 (two) times daily. Patient not taking: Reported on 11/01/2017 09/10/17   Vanessa Kick, MD  ferrous sulfate 325 (65 FE) MG tablet Take 325 mg by mouth 3 (three) times a week.     [provider]  ibuprofen (ADVIL,MOTRIN) 600 MG tablet Take 1 tablet (600 mg total) by mouth every 6 (six) hours as needed. Patient not taking: Reported on 11/01/2017 09/10/17   Vanessa Kick, MD  oxyCODONE-acetaminophen (ROXICET) 5-325 MG tablet Take 1-2 tablets by mouth every 4 (four) hours as needed for severe pain. Patient not taking: Reported on 11/01/2017 09/10/17   Vanessa Kick, MD  Prenatal Vit-Fe Fumarate-FA (PRENATAL MULTIVITAMIN) TABS tablet Take 1 tablet by mouth daily.     [provider]    Family History History reviewed. No pertinent family history.  Social History Social History   Tobacco Use  . Smoking status: Never Smoker  . Smokeless tobacco: Never Used  Substance Use Topics  . Alcohol use: No  . Drug use: No     Allergies   Patient has no known allergies.  Review of Systems Review of Systems  Constitutional: Negative for fever.  HENT: Negative for hearing loss, sore throat and tinnitus.   Eyes: Positive for blurred vision and visual disturbance. Negative for double vision.  Respiratory: Negative for shortness of breath.   Cardiovascular: Negative for chest pain.  Gastrointestinal: Negative for abdominal pain, nausea and vomiting.  Genitourinary: Negative for dysuria.  Musculoskeletal: Negative for neck pain.  Skin: Negative for rash.  Neurological: Positive for headaches. Negative for focal weakness, seizures, loss of consciousness and numbness.  Psychiatric/Behavioral: Negative for memory loss.     Physical Exam Updated Vital Signs BP 122/80   Pulse 95   Temp 98 F (36.7 C) (Oral)   Resp 12   Ht 5\' 5"  (1.651 m)   Wt 72.6 kg   SpO2 100%   BMI 26.63 kg/m    Physical Exam Vitals signs and nursing note reviewed.  Constitutional:      General: She is not in acute distress.    Appearance: She is well-developed.  HENT:     Head: Normocephalic and atraumatic.  Eyes:     Conjunctiva/sclera: Conjunctivae normal.  Neck:     Musculoskeletal: Neck supple.  Cardiovascular:     Rate and Rhythm: Normal rate and regular rhythm.     Heart sounds: No murmur.  Pulmonary:     Effort: Pulmonary effort is normal. No respiratory distress.     Breath sounds: Normal breath sounds.  Abdominal:     Palpations: Abdomen is soft.     Tenderness: There is no abdominal tenderness.  Musculoskeletal: Normal range of motion.        General: No signs of injury.  Skin:    General: Skin is warm and dry.     Capillary Refill: Capillary refill takes less than 2 seconds.  Neurological:     General: No focal deficit present.     Mental Status: She is alert and oriented to person, place, and time.     Cranial Nerves: No cranial nerve deficit.     Sensory: No sensory deficit.     Motor: No weakness.     Gait: Gait normal.      ED Treatments / Results  Labs (all labs ordered are listed, but only abnormal results are displayed) Labs Reviewed - No data to display  EKG None  Radiology No results found.  Procedures Procedures (including critical care time)  Medications Ordered in ED Medications - No data to display   Initial Impression / Assessment and Plan / ED Course  I have reviewed the triage vital signs and the nursing notes.  Pertinent labs & imaging results that were available during my care of the patient were reviewed by me and considered in my medical decision making (see chart for details).  Clinical Course as of Aug 15 1639  Sat Aug 15, 2019  [address]52 28 year old female with head injury direct blow to her head a couple of days ago still with headache and some difficulty concentrating.  We discussed indications for CAT scan and she said she would  rather observe her symptoms.  We will give her a note for work.  She understands to return if any worsening symptoms.   [MB]    Clinical Course User Index [MB] Terrilee Files, MD         Final Clinical Impressions(s) / ED Diagnoses   Final diagnoses:  Postconcussion syndrome  Injury of head, initial encounter    ED Discharge Orders  None       Terrilee FilesButler, Delene Morais C, MD 08/15/19 1640
